# Patient Record
Sex: Female | Born: 1960 | Race: Black or African American | Hispanic: No | Smoking: Current some day smoker
Health system: Southern US, Community
[De-identification: ages and names within clinical notes are randomized; demographics above are authoritative.]

---

## 2003-11-21 ENCOUNTER — Encounter: Admission: RE | Admit: 2003-11-21 | Discharge: 2003-11-21 | Payer: Self-pay | Admitting: Occupational Medicine

## 2003-11-30 ENCOUNTER — Encounter: Admission: RE | Admit: 2003-11-30 | Discharge: 2003-11-30 | Payer: Self-pay | Admitting: Occupational Medicine

## 2005-11-14 ENCOUNTER — Encounter: Admission: RE | Admit: 2005-11-14 | Discharge: 2005-11-14 | Payer: Self-pay | Admitting: Occupational Medicine

## 2010-06-26 ENCOUNTER — Emergency Department (HOSPITAL_COMMUNITY)
Admission: EM | Admit: 2010-06-26 | Discharge: 2010-06-26 | Disposition: A | Discharge status: Home / Self Care | Payer: No Typology Code available for payment source | Attending: Emergency Medicine | Admitting: Emergency Medicine

## 2010-06-26 DIAGNOSIS — R51 Headache: Secondary | ICD-10-CM | POA: Insufficient documentation

## 2011-02-28 ENCOUNTER — Emergency Department (HOSPITAL_COMMUNITY)
Admission: EM | Admit: 2011-02-28 | Discharge: 2011-02-28 | Disposition: A | Discharge status: Home / Self Care | Payer: No Typology Code available for payment source | Attending: Emergency Medicine | Admitting: Emergency Medicine

## 2011-02-28 ENCOUNTER — Encounter (HOSPITAL_COMMUNITY): Payer: Self-pay | Admitting: *Deleted

## 2011-02-28 DIAGNOSIS — R51 Headache: Secondary | ICD-10-CM | POA: Insufficient documentation

## 2011-02-28 MED ORDER — IBUPROFEN 200 MG PO TABS
400.0000 mg | ORAL_TABLET | Freq: Once | ORAL | Status: AC
  Administered 2011-02-28: 400 mg via ORAL
  Filled 2011-02-28: qty 2
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
## 2011-02-28 NOTE — ED Notes (Signed)
MD at bedside. 
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
## 2012-12-09 ENCOUNTER — Emergency Department (INDEPENDENT_AMBULATORY_CARE_PROVIDER_SITE_OTHER)
Admission: EM | Admit: 2012-12-09 | Discharge: 2012-12-09 | Disposition: A | Discharge status: Home / Self Care | Payer: Self-pay | Source: Home / Self Care

## 2012-12-09 ENCOUNTER — Encounter (HOSPITAL_COMMUNITY): Payer: Self-pay | Admitting: Emergency Medicine

## 2012-12-09 DIAGNOSIS — M542 Cervicalgia: Secondary | ICD-10-CM

## 2012-12-09 DIAGNOSIS — R51 Headache: Secondary | ICD-10-CM
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
## 2012-12-12 NOTE — ED Provider Notes (Signed)
Medical screening examination/treatment/procedure(s) were performed by a resident physician or non-physician practitioner and as the supervising physician I was immediately available for consultation/collaboration.  Irl Bodie, MD    Kandance Yano S Harlem Thresher, MD 12/12/12 0857 

## 2019-06-01 ENCOUNTER — Other Ambulatory Visit: Payer: Self-pay | Admitting: Occupational Medicine

## 2019-06-01 ENCOUNTER — Ambulatory Visit: Payer: Self-pay

## 2019-06-01 ENCOUNTER — Other Ambulatory Visit: Payer: Self-pay

## 2019-06-01 DIAGNOSIS — M25562 Pain in left knee: Secondary | ICD-10-CM

## 2020-04-10 ENCOUNTER — Ambulatory Visit: Payer: Self-pay | Admitting: Internal Medicine

## 2020-04-10 ENCOUNTER — Encounter: Payer: Self-pay | Admitting: Internal Medicine

## 2020-04-10 VITALS — BP 139/76 | HR 73 | Ht 66.0 in | Wt 160.1 lb

## 2020-04-10 DIAGNOSIS — R200 Anesthesia of skin: Secondary | ICD-10-CM

## 2020-04-10 DIAGNOSIS — Z1231 Encounter for screening mammogram for malignant neoplasm of breast: Secondary | ICD-10-CM

## 2020-04-10 DIAGNOSIS — Z114 Encounter for screening for human immunodeficiency virus [HIV]: Secondary | ICD-10-CM

## 2020-04-10 DIAGNOSIS — Z1159 Encounter for screening for other viral diseases: Secondary | ICD-10-CM

## 2020-04-10 DIAGNOSIS — Z1272 Encounter for screening for malignant neoplasm of vagina: Secondary | ICD-10-CM

## 2020-04-10 DIAGNOSIS — Z0000 Encounter for general adult medical examination without abnormal findings: Secondary | ICD-10-CM

## 2020-04-10 DIAGNOSIS — R030 Elevated blood-pressure reading, without diagnosis of hypertension: Secondary | ICD-10-CM

## 2020-04-10 DIAGNOSIS — Z131 Encounter for screening for diabetes mellitus: Secondary | ICD-10-CM

## 2020-04-10 DIAGNOSIS — Z111 Encounter for screening for respiratory tuberculosis: Secondary | ICD-10-CM

## 2020-04-10 NOTE — Assessment & Plan Note (Signed)
She has tingling in the very distal tip of her right fourth finger on the dorsal aspect, present for one week. She denies pain in her fingers or hands, never occurred before. She did not injury her finger that she knows of but is wondering if she slept on it strangely. Strength and overall sensation intact. Negative phalen's, not consistent with ulnar nerve entrapment.  With distribution, unlikely to be metabolic. Discussed following up if any acute change in her symptoms. She does need additional screening labs but would like to wait until she has the orange card.   - f/u if symptoms worsen or change

## 2020-04-10 NOTE — Assessment & Plan Note (Signed)
Blood pressure mildly elevated. No history of hypertension.  - return in one week for blood pressure check

## 2020-04-10 NOTE — Progress Notes (Signed)
   CC: elevated blood pressure  HPI:  Amy Hawkins is a 60 y.o. with PMH as below presenting today for physical evaluation for a new job  Please see A&P for assessment of the patient's acute and chronic medical conditions.    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  Review of Systems:   Review of Systems  Constitutional: Negative for chills, diaphoresis, fever, malaise/fatigue and weight loss.  Eyes: Negative for blurred vision and double vision.  Respiratory: Negative for cough, sputum production, shortness of breath and wheezing.   Cardiovascular: Negative for chest pain, palpitations and leg swelling.  Gastrointestinal: Negative for abdominal pain, heartburn, nausea and vomiting.  Genitourinary: Negative for dysuria, frequency and urgency.  Musculoskeletal: Negative for back pain, falls, joint pain and myalgias.  Neurological: Negative for dizziness, focal weakness, weakness and headaches.  Psychiatric/Behavioral: Negative for depression, substance abuse and suicidal ideas.     Physical Exam:  Constitution: NAD, appears stated age HENT: [place]/AT, no cervical lymphadenopathy, no thyroid nodules Eyes: no icterus or injection  Cardio: RRR, no m/r/g, no LE edema, 2+ radial pulses Respiratory: CTA, no w/r/r Abdominal: NTTP, soft, non-distended, normal BS  MSK: moving all extremities Neuro: normal affect, a&ox3 Skin: c/d/i    Vitals:   04/10/20 1405  BP: 139/76  Pulse: 73  SpO2: 100%  Weight: 160 lb  1.6 oz (72.6 kg)  Height: 5\' 6"  (1.676 m)     Assessment & Plan:   See Encounters Tab for problem based charting.  Patient discussed with Dr. 

## 2020-04-10 NOTE — Patient Instructions (Signed)
Thank you for allowing [place] to provide your care today.   I have ordered the following labs for you:  HIV, hepatitis C  Please follow up in two days to have your tuberculin skin test read. You can then follow-up in one week to have your blood pressure rechecked.   Please make an appointment to obtain the orange card.   Please call the internal medicine center clinic if you have any questions or concerns, we may be able to help and keep you from a long and expensive emergency room wait. Our clinic and after hours phone number is [phone], the best time to call is Monday through Friday 9 am to 4 pm but there is always someone available 24/7 if you have an emergency. If you need medication refills please notify your pharmacy one week in advance and they will send [place] a request.

## 2020-04-10 NOTE — Assessment & Plan Note (Addendum)
Here today to have paperwork filled out for a new job. She has not seen a physician in many years. Overall history and physical exam normal.  - referral for mammogram, no prior - patient given information on B[facility]CP for pap smear and mammogram - discussed necessity of colonoscopy or Fit test  - HIV, Hepatitis C, hemoglobin a1c - tuberuclin skin test for job  - she will make an appointment for orange card

## 2020-04-11 LAB — HEPATITIS C ANTIBODY: Hep C Virus Ab: <0.1 s/co ratio (ref 0.0–0.9)

## 2020-04-11 LAB — HIV ANTIBODY (ROUTINE TESTING W REFLEX): HIV Screen 4th Generation wRfx: NONREACTIVE

## 2020-04-11 LAB — HEMOGLOBIN A1C
Est. average glucose Bld gHb Est-mCnc: 111 mg/dL
Hgb A1c MFr Bld: 5.5 % (ref 4.8–5.6)

## 2020-04-11 NOTE — Progress Notes (Signed)
Internal Medicine Clinic Attending  Case discussed with Dr. Seawell  At the time of the visit.  We reviewed the resident's history and exam and pertinent patient test results.  I agree with the assessment, diagnosis, and plan of care documented in the resident's note.  

## 2020-04-23 ENCOUNTER — Other Ambulatory Visit: Payer: Self-pay | Admitting: Obstetrics and Gynecology

## 2020-04-23 DIAGNOSIS — Z1231 Encounter for screening mammogram for malignant neoplasm of breast: Secondary | ICD-10-CM

## 2020-04-24 ENCOUNTER — Ambulatory Visit: Payer: Self-pay

## 2020-05-15 ENCOUNTER — Other Ambulatory Visit: Payer: Self-pay

## 2020-05-15 ENCOUNTER — Encounter (INDEPENDENT_AMBULATORY_CARE_PROVIDER_SITE_OTHER): Payer: Self-pay

## 2020-05-15 ENCOUNTER — Ambulatory Visit: Payer: No Typology Code available for payment source | Admitting: *Deleted

## 2020-05-15 ENCOUNTER — Ambulatory Visit
Admission: RE | Admit: 2020-05-15 | Discharge: 2020-05-15 | Disposition: A | Discharge status: Home / Self Care | Payer: Self-pay | Source: Ambulatory Visit | Attending: Obstetrics and Gynecology | Admitting: Obstetrics and Gynecology

## 2020-05-15 VITALS — BP 118/76 | Wt 162.6 lb

## 2020-05-15 DIAGNOSIS — Z01419 Encounter for gynecological examination (general) (routine) without abnormal findings: Secondary | ICD-10-CM

## 2020-05-15 DIAGNOSIS — Z1231 Encounter for screening mammogram for malignant neoplasm of breast: Secondary | ICD-10-CM

## 2020-05-15 NOTE — Progress Notes (Signed)
Ms. CLOTEAL ISAACSON is a 60 y.o. No obstetric history on file. female who presents to [facility] clinic today with complaint of PMB x 1 week ago. Patient stated she has not had a menstrual period for 7 years and had vaginal spotting. Patient informed the importance of follow-up and that the follow-up is not covered by BCCCP. Patient given the [facility] Financial Assistance Application and will refer to the [facility] for [facility].    Pap Smear: Pap smear completed today. Last Pap smear was around 30 years ago and was normal per patient. Per patient has no history of an abnormal Pap smear. Last Pap smear result is not available in Epic.   Physical exam: Breasts Breasts symmetrical. No skin abnormalities bilateral breasts. No nipple retraction bilateral breasts. No nipple discharge bilateral breasts. No lymphadenopathy. No lumps palpated bilateral breasts. No complaints of pain or tenderness on exam.       Pelvic/Bimanual Ext Genitalia No lesions, no swelling and no discharge observed on external genitalia.        Vagina Vagina pink and normal texture. No lesions and copious thick white discharge observed in vagina. Wet prep completed.      Cervix Cervix is present. Cervix pink and of normal texture. Thick white discharge observed on cervix.   Uterus Uterus is present and palpable. Uterus in normal position and normal size.        Adnexae Bilateral ovaries present and palpable. No tenderness on palpation.         Rectovaginal No rectal exam completed today since patient had no rectal complaints. No skin abnormalities observed on exam.     Smoking History: Patient is a current smoker. Discussed smoking cessation with patient. Referred to the [facility] Quitline and gave resources to the free smoking cessation classes at [facility].   Patient Navigation: Patient education provided. Access to services provided for patient through B[facility]CP program.   Colorectal Cancer Screening: Per  patient has never had colonoscopy completed. No complaints today.    Breast and Cervical Cancer Risk Assessment: Patient does not have family history of breast cancer, known genetic mutations, or radiation treatment to the chest before age 55. Patient does not have history of cervical dysplasia, immunocompromised, or DES exposure in-utero.  Risk Assessment    Risk Scores      05/15/2020   Last edited by: Narda Rutherford, LPN   5-year risk: 1.4 %   Lifetime risk: 7.3 %          A: BCCCP exam with pap smear No complaints.  P: Referred patient to the [facility] for a screening mammogram on mobile unit. Appointment scheduled Tuesday, May 15, 2020 at 1350.  Priscille Heidelberg, RN 05/15/2020 1:55 PM

## 2020-05-15 NOTE — Patient Instructions (Signed)
Explained breast self awareness with Azalee Course. Pap smear completed today. Let her know B[facility]CP will cover Pap smears and HPV typing every 5 years unless has a history of abnormal Pap smears. Referred patient to the [facility] for a screening mammogram on mobile unit. Appointment scheduled Tuesday, May 15, 2020 at 1350. Patient escorted to the mobile unit following BCCCP appointment for her screening mammogram. Let patient know will follow up with her within the next week with results of her wet prep and Pap smear by phone. Informed patient that the Breast Center will follow-up with them within the next couple of weeks with results of her mammogram by letter or phone. Discussed smoking cessation with patient. Referred to the [facility] Quitline and gave resources to the free smoking cessation classes at [facility]. Halen Mossbarger Mcchesney verbalized understanding.  Shriyan Arakawa, Kathaleen Maser, RN 1:55 PM

## 2020-05-16 ENCOUNTER — Telehealth: Payer: Self-pay

## 2020-05-16 LAB — CERVICOVAGINAL ANCILLARY ONLY
Bacterial Vaginitis (gardnerella): NEGATIVE
Candida Glabrata: NEGATIVE
Candida Vaginitis: NEGATIVE
Comment: NEGATIVE
Comment: NEGATIVE
Comment: NEGATIVE
Comment: NEGATIVE
Trichomonas: POSITIVE — AB

## 2020-05-16 NOTE — Telephone Encounter (Addendum)
Patient returned call, informed Wet prep results, positive for trichomonas, needs rx metronidazole. Patient verbalized understanding, stated she was out of town currently, needs to call back with pharmacy name.    Attempted to contact Patient regarding lab results. Left message on voicemail requesting return call.

## 2020-05-17 ENCOUNTER — Other Ambulatory Visit: Payer: Self-pay

## 2020-05-17 MED ORDER — METRONIDAZOLE 500 MG PO TABS: 500.0000 mg | ORAL_TABLET | Freq: Two times a day (BID) | ORAL | 0 refills | Status: DC

## 2020-05-17 MED ORDER — METRONIDAZOLE 500 MG PO TABS: 500.0000 mg | ORAL_TABLET | Freq: Two times a day (BID) | ORAL | 0 refills | Status: AC

## 2020-05-17 NOTE — Addendum Note (Signed)
Addended by: Catalina Antigua on: 05/17/2020 10:20 AM   Modules accepted: Orders

## 2020-05-21 ENCOUNTER — Telehealth: Payer: Self-pay

## 2020-05-21 NOTE — Telephone Encounter (Signed)
Attempted to contact patient regarding Pap/HPV results. Left message on voicemail requesting a return call.

## 2020-05-24 ENCOUNTER — Telehealth: Payer: Self-pay

## 2020-05-24 LAB — CYTOLOGY - PAP
Comment: NEGATIVE
Diagnosis: NEGATIVE
High risk HPV: NEGATIVE

## 2020-05-24 NOTE — Telephone Encounter (Signed)
Patient informed negative Pap/HPV results, next pap due in 3-5 years, previously informed and treated for Trichomonas per Wet prep results. Patient verbalized understanding.

## 2021-08-18 IMAGING — MG MM DIGITAL SCREENING BILAT W/ TOMO AND CAD
8 series · 8 of 24 positions shown · non-contrast
Comparison: None.

CLINICAL DATA: Screening.

EXAM:
DIGITAL SCREENING BILATERAL MAMMOGRAM WITH TOMOSYNTHESIS AND CAD
TECHNIQUE: Bilateral screening digital craniocaudal and mediolateral oblique
mammograms were obtained. Bilateral screening digital breast
tomosynthesis was performed. The images were evaluated with
computer-aided detection.

[R MLO synth-2D]
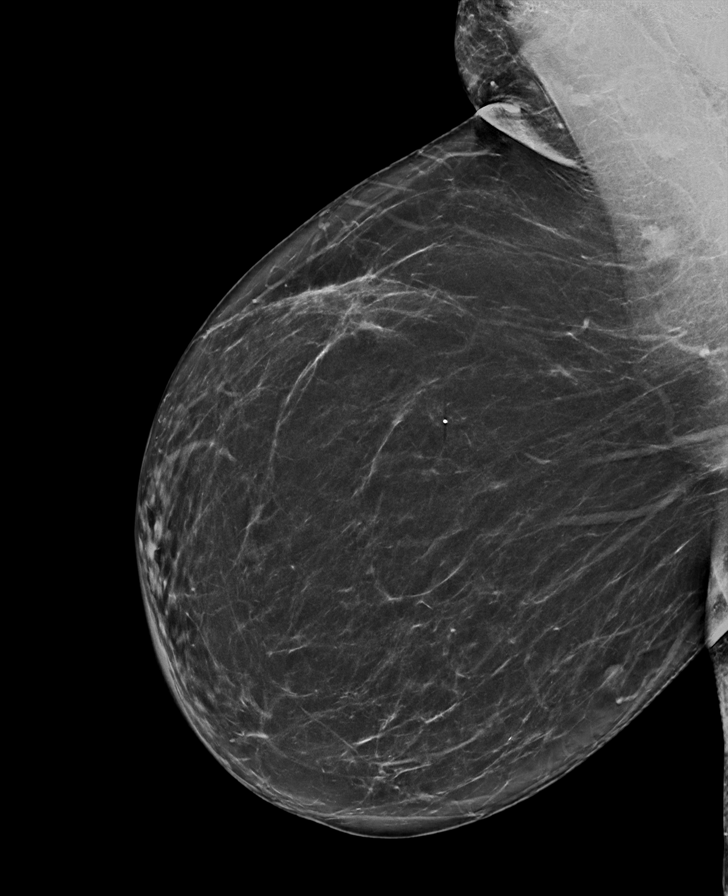

[L CC synth-2D]
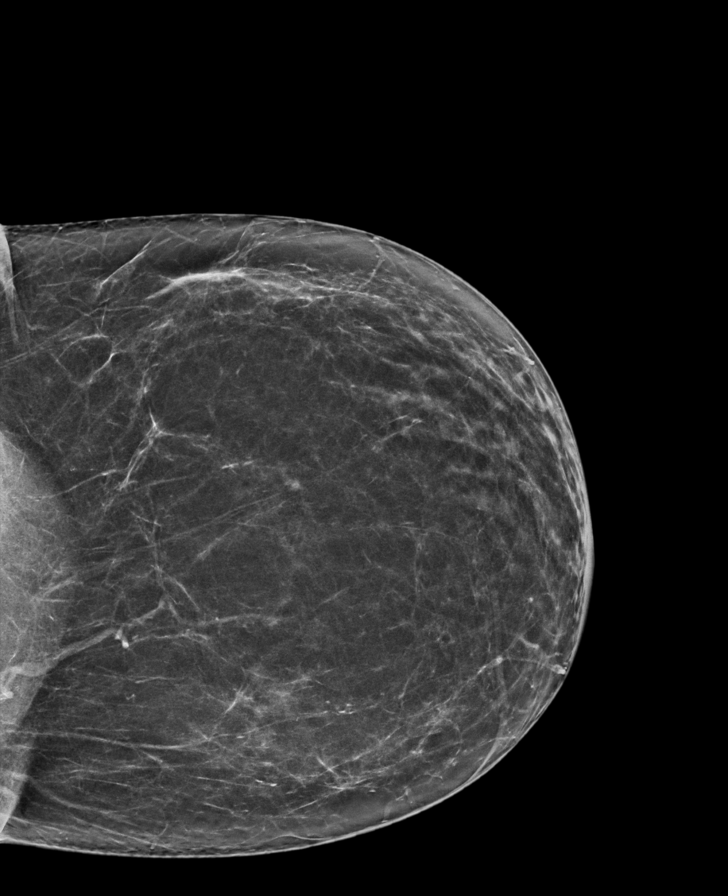

[L MLO synth-2D]
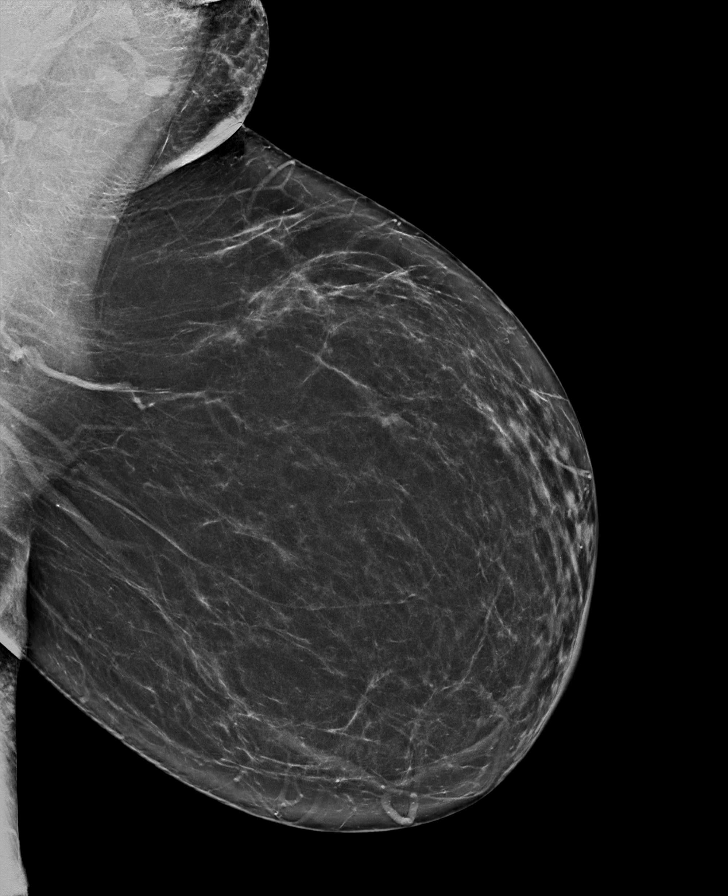

[R CC synth-2D]
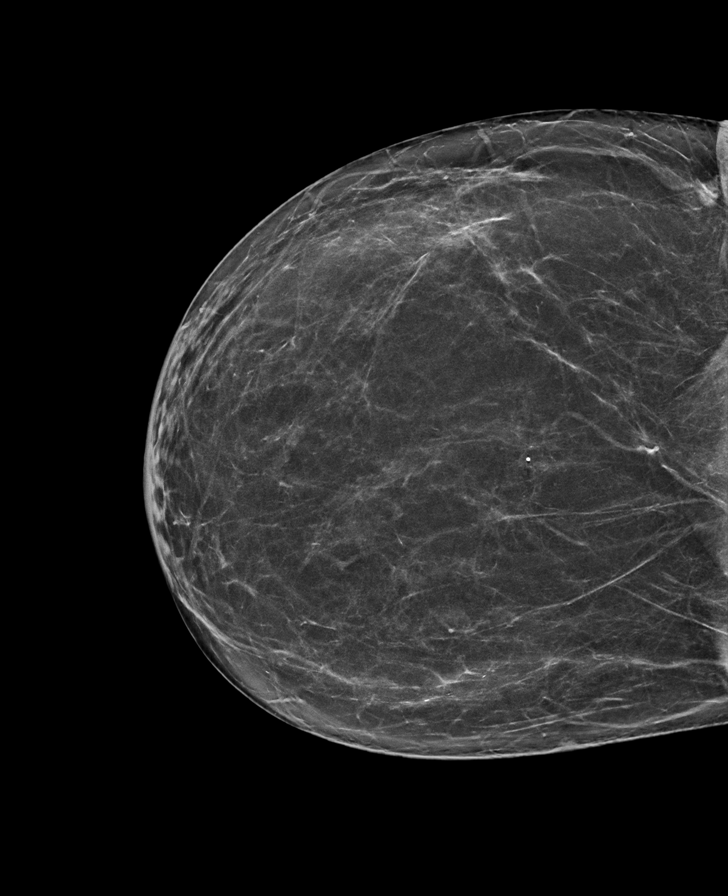

[R MLO tomo · tomo slice 41/80.0]
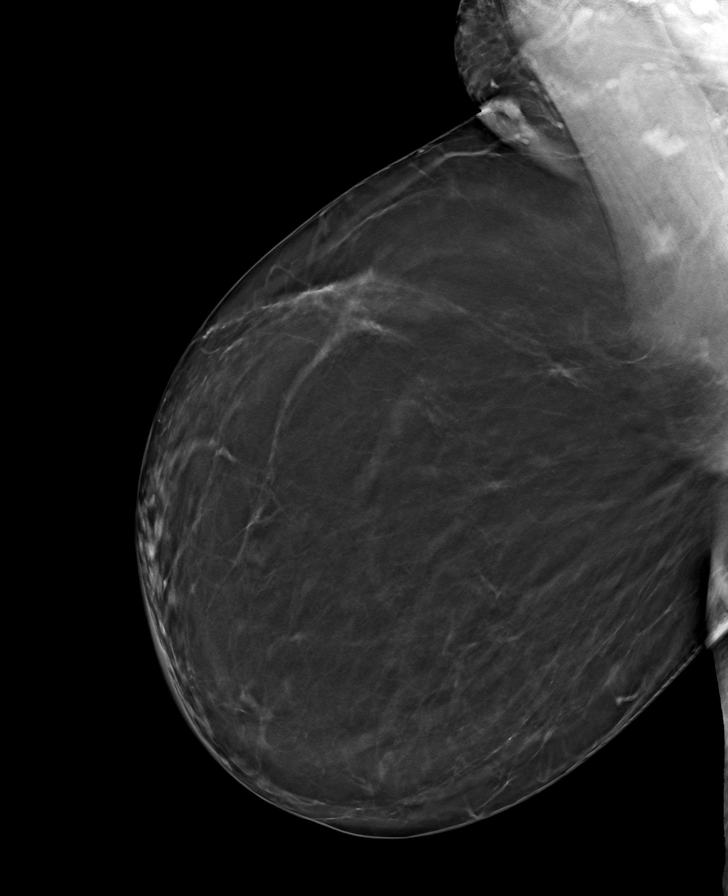

[L CC tomo · tomo slice 36/71.0]
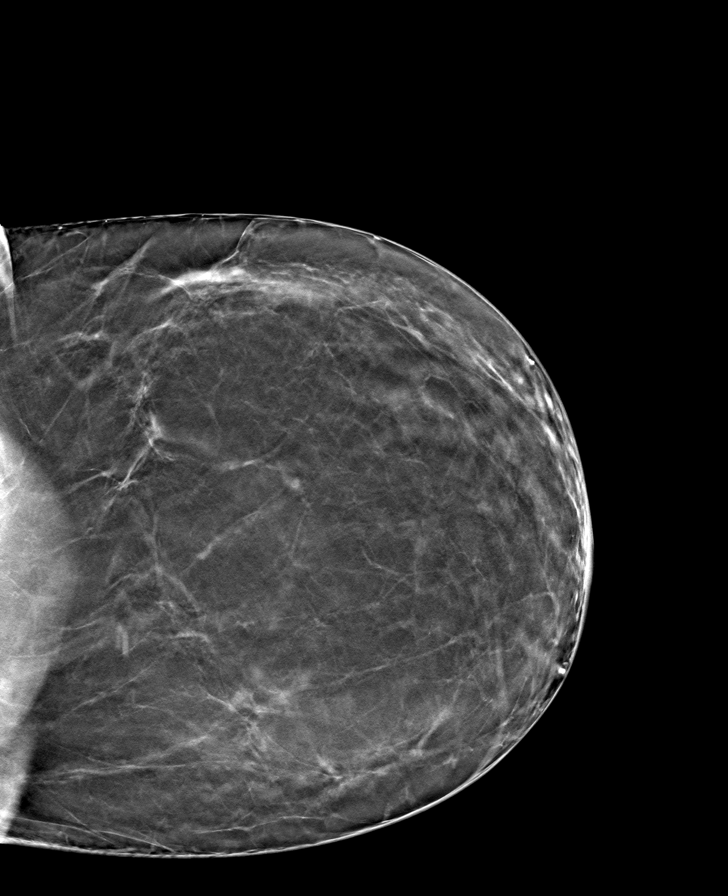

[L MLO tomo · tomo slice 39/77.0]
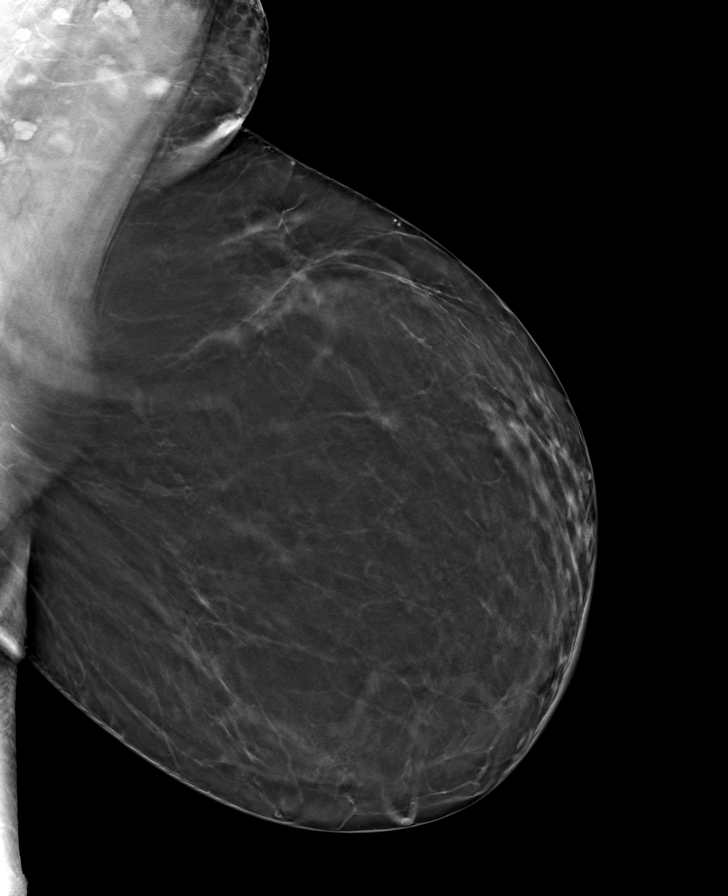

[R CC tomo · tomo slice 37/73.0]
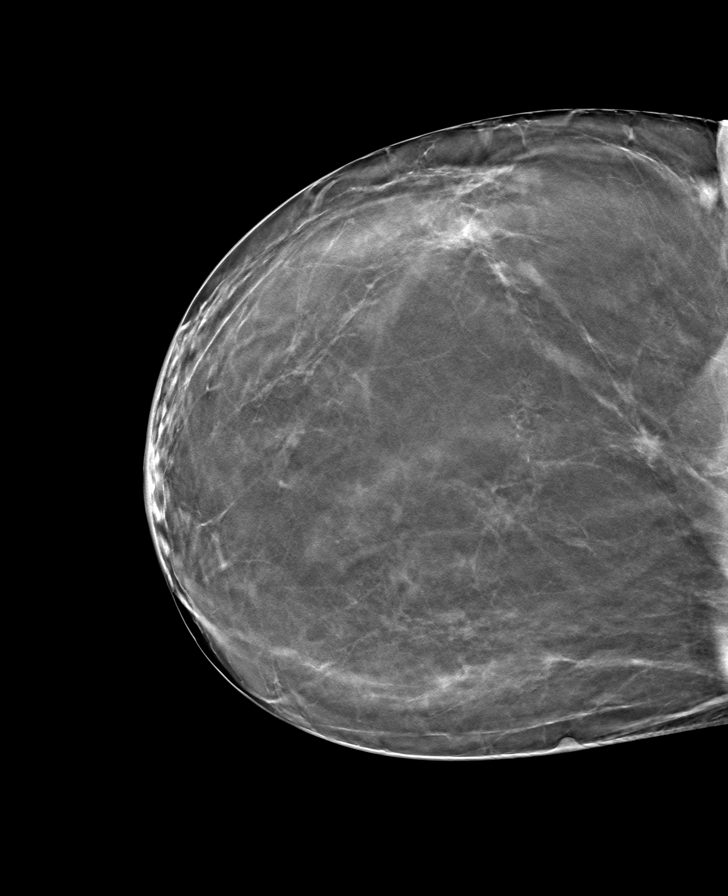

[8 of 24 positions shown; findings below may reference images not displayed]

ACR Breast Density Category b: There are scattered areas of
fibroglandular density.
FINDINGS: There are no findings suspicious for malignancy. The images were
evaluated with computer-aided detection.
IMPRESSION: No mammographic evidence of malignancy. A result letter of this
screening mammogram will be mailed directly to the patient.

RECOMMENDATION:
Screening mammogram in one year. (Code:C7-6-ASJ)

BI-RADS CATEGORY  1: Negative.
